# Patient Record
Sex: Male | Born: 1935 | Race: White | Hispanic: No | Smoking: Current some day smoker
Health system: Southern US, Community
[De-identification: ages and names within clinical notes are randomized; demographics above are authoritative.]

## PROBLEM LIST (undated history)

## (undated) DIAGNOSIS — K219 Gastro-esophageal reflux disease without esophagitis: Secondary | ICD-10-CM

## (undated) DIAGNOSIS — F32A Depression, unspecified: Secondary | ICD-10-CM

## (undated) DIAGNOSIS — I959 Hypotension, unspecified: Secondary | ICD-10-CM

## (undated) DIAGNOSIS — F329 Major depressive disorder, single episode, unspecified: Secondary | ICD-10-CM

## (undated) DIAGNOSIS — F419 Anxiety disorder, unspecified: Secondary | ICD-10-CM

## (undated) HISTORY — PX: CHOLECYSTECTOMY: SHX55

---

## 2010-08-01 ENCOUNTER — Emergency Department (HOSPITAL_BASED_OUTPATIENT_CLINIC_OR_DEPARTMENT_OTHER)
Admission: EM | Admit: 2010-08-01 | Discharge: 2010-08-01 | Disposition: A | Discharge status: Home / Self Care | Payer: Medicare PPO | Attending: Emergency Medicine | Admitting: Emergency Medicine

## 2010-08-01 DIAGNOSIS — Z79899 Other long term (current) drug therapy: Secondary | ICD-10-CM | POA: Insufficient documentation

## 2010-08-01 DIAGNOSIS — F17200 Nicotine dependence, unspecified, uncomplicated: Secondary | ICD-10-CM | POA: Insufficient documentation

## 2010-08-01 DIAGNOSIS — N390 Urinary tract infection, site not specified: Secondary | ICD-10-CM | POA: Insufficient documentation

## 2010-08-01 DIAGNOSIS — K219 Gastro-esophageal reflux disease without esophagitis: Secondary | ICD-10-CM | POA: Insufficient documentation

## 2010-08-01 DIAGNOSIS — F3289 Other specified depressive episodes: Secondary | ICD-10-CM | POA: Insufficient documentation

## 2010-08-01 DIAGNOSIS — F329 Major depressive disorder, single episode, unspecified: Secondary | ICD-10-CM | POA: Insufficient documentation

## 2010-08-01 DIAGNOSIS — R300 Dysuria: Secondary | ICD-10-CM | POA: Insufficient documentation

## 2010-08-01 LAB — DIFFERENTIAL
Lymphocytes Relative: 9 % — ABNORMAL LOW (ref 12–46)
Monocytes Absolute: 1.3 10*3/uL — ABNORMAL HIGH (ref 0.1–1.0)
Monocytes Relative: 8 % (ref 3–12)
Neutro Abs: 13.7 10*3/uL — ABNORMAL HIGH (ref 1.7–7.7)

## 2010-08-01 LAB — URINALYSIS, ROUTINE W REFLEX MICROSCOPIC
Ketones, ur: NEGATIVE mg/dL
Nitrite: NEGATIVE
Protein, ur: NEGATIVE mg/dL

## 2010-08-01 LAB — BASIC METABOLIC PANEL
CO2: 24 mEq/L (ref 19–32)
Calcium: 9.3 mg/dL (ref 8.4–10.5)
Glucose, Bld: 106 mg/dL — ABNORMAL HIGH (ref 70–99)
Sodium: 138 mEq/L (ref 135–145)

## 2010-08-01 LAB — CBC
HCT: 39.5 % (ref 39.0–52.0)
Hemoglobin: 13.5 g/dL (ref 13.0–17.0)
MCH: 30.5 pg (ref 26.0–34.0)
MCHC: 34.2 g/dL (ref 30.0–36.0)

## 2010-08-04 LAB — URINE CULTURE: Culture  Setup Time: 201205040901

## 2014-12-04 ENCOUNTER — Other Ambulatory Visit: Payer: Self-pay

## 2014-12-04 ENCOUNTER — Encounter (HOSPITAL_BASED_OUTPATIENT_CLINIC_OR_DEPARTMENT_OTHER): Payer: Self-pay

## 2014-12-04 ENCOUNTER — Emergency Department (HOSPITAL_BASED_OUTPATIENT_CLINIC_OR_DEPARTMENT_OTHER): Payer: Medicare (Managed Care)

## 2014-12-04 ENCOUNTER — Emergency Department (HOSPITAL_BASED_OUTPATIENT_CLINIC_OR_DEPARTMENT_OTHER)
Admission: EM | Admit: 2014-12-04 | Discharge: 2014-12-04 | Disposition: A | Discharge status: Other Acute Inpatient Hospital | Payer: Medicare (Managed Care) | Attending: Emergency Medicine | Admitting: Emergency Medicine

## 2014-12-04 DIAGNOSIS — F419 Anxiety disorder, unspecified: Secondary | ICD-10-CM | POA: Insufficient documentation

## 2014-12-04 DIAGNOSIS — F329 Major depressive disorder, single episode, unspecified: Secondary | ICD-10-CM | POA: Insufficient documentation

## 2014-12-04 DIAGNOSIS — K219 Gastro-esophageal reflux disease without esophagitis: Secondary | ICD-10-CM | POA: Diagnosis not present

## 2014-12-04 DIAGNOSIS — I951 Orthostatic hypotension: Secondary | ICD-10-CM

## 2014-12-04 DIAGNOSIS — Z720 Tobacco use: Secondary | ICD-10-CM | POA: Diagnosis not present

## 2014-12-04 DIAGNOSIS — R55 Syncope and collapse: Secondary | ICD-10-CM

## 2014-12-04 HISTORY — DX: Major depressive disorder, single episode, unspecified: F32.9

## 2014-12-04 HISTORY — DX: Depression, unspecified: F32.A

## 2014-12-04 HISTORY — DX: Hypotension, unspecified: I95.9

## 2014-12-04 HISTORY — DX: Anxiety disorder, unspecified: F41.9

## 2014-12-04 HISTORY — DX: Gastro-esophageal reflux disease without esophagitis: K21.9

## 2014-12-04 LAB — COMPREHENSIVE METABOLIC PANEL
ALBUMIN: 3.1 g/dL — AB (ref 3.5–5.0)
ALK PHOS: 52 U/L (ref 38–126)
ALT: 12 U/L — AB (ref 17–63)
ANION GAP: 8 (ref 5–15)
AST: 19 U/L (ref 15–41)
BUN: 24 mg/dL — ABNORMAL HIGH (ref 6–20)
CALCIUM: 8.7 mg/dL — AB (ref 8.9–10.3)
CHLORIDE: 106 mmol/L (ref 101–111)
CO2: 23 mmol/L (ref 22–32)
Creatinine, Ser: 1.95 mg/dL — ABNORMAL HIGH (ref 0.61–1.24)
GFR calc non Af Amer: 31 mL/min — ABNORMAL LOW (ref >60)
GFR, EST AFRICAN AMERICAN: 36 mL/min — AB (ref >60)
GLUCOSE: 105 mg/dL — AB (ref 65–99)
Potassium: 4.5 mmol/L (ref 3.5–5.1)
SODIUM: 137 mmol/L (ref 135–145)
Total Bilirubin: 0.1 mg/dL — ABNORMAL LOW (ref 0.3–1.2)
Total Protein: 6.7 g/dL (ref 6.5–8.1)

## 2014-12-04 LAB — CBC WITH DIFFERENTIAL/PLATELET
Basophils Absolute: 0.1 10*3/uL (ref 0.0–0.1)
Basophils Relative: 1 % (ref 0–1)
Eosinophils Absolute: 0.3 10*3/uL (ref 0.0–0.7)
Eosinophils Relative: 4 % (ref 0–5)
HEMATOCRIT: 38.5 % — AB (ref 39.0–52.0)
HEMOGLOBIN: 12.8 g/dL — AB (ref 13.0–17.0)
LYMPHS ABS: 1.9 10*3/uL (ref 0.7–4.0)
Lymphocytes Relative: 21 % (ref 12–46)
MCH: 30 pg (ref 26.0–34.0)
MCHC: 33.2 g/dL (ref 30.0–36.0)
MCV: 90.2 fL (ref 78.0–100.0)
MONO ABS: 0.7 10*3/uL (ref 0.1–1.0)
MONOS PCT: 8 % (ref 3–12)
NEUTROS ABS: 5.9 10*3/uL (ref 1.7–7.7)
NEUTROS PCT: 66 % (ref 43–77)
Platelets: 266 10*3/uL (ref 150–400)
RBC: 4.27 MIL/uL (ref 4.22–5.81)
RDW: 14.2 % (ref 11.5–15.5)
WBC: 8.9 10*3/uL (ref 4.0–10.5)

## 2014-12-04 LAB — TROPONIN I: Troponin I: <0.03 ng/mL (ref <0.031)

## 2014-12-04 MED ORDER — SODIUM CHLORIDE 0.9 % IV BOLUS (SEPSIS)
1000.0000 mL | Freq: Once | INTRAVENOUS | Status: AC
  Administered 2014-12-04: 1000 mL via INTRAVENOUS

## 2014-12-04 MED ORDER — SODIUM CHLORIDE 0.9 % IV BOLUS (SEPSIS)
500.0000 mL | Freq: Once | INTRAVENOUS | Status: AC
  Administered 2014-12-04: 500 mL via INTRAVENOUS

## 2014-12-04 MED ORDER — SODIUM CHLORIDE 0.9 % IV SOLN: INTRAVENOUS | Status: DC

## 2014-12-04 NOTE — ED Notes (Addendum)
Spoke with elease at [facility] about bed placement, rn made aware of plans.

## 2014-12-04 NOTE — ED Provider Notes (Signed)
CSN: [id number]     Arrival date & time 12/04/14  1230 History   First MD Initiated Contact with Patient 12/04/14 1250     Chief Complaint  Patient presents with  . Loss of Consciousness     (Consider location/radiation/quality/duration/timing/severity/associated sxs/prior Treatment) HPI Comments: Patient is a 81 year old male with history of depression, GERD. He presents for evaluation of weakness and near syncope. On 2 occasions this morning the patient experienced near syncopal episodes. He is getting out of the car and walking into the house when he suddenly felt as though he was going to faint. He had to grab the railing to the stairs and sit down for several minutes until the feeling passed. His wife helped him into the house where shortly afterward he experienced another episode. He was walking to the refrigerator and he began to feel as he was going to faint. His wife helped keep him from falling. Patient never lost consciousness. He denies any palpitations, chest pain, shortness of breath, or other symptoms. He states he does feel somewhat weak. His wife reports he was started on a new try cyclic antidepressant and last week. He was on this for several days before stopping it due to his not feeling well while taking it.  Patient is a 81 y.o. male presenting with syncope. The history is provided by the patient.  Loss of Consciousness Episode history:  Multiple Most recent episode:  Today Timing:  Intermittent Progression:  Resolved Chronicity:  New Witnessed: yes   Relieved by:  Nothing Worsened by:  Nothing tried Ineffective treatments:  None tried Associated symptoms: no chest pain, no palpitations and no shortness of breath     Past Medical History  Diagnosis Date  . Hypotension   . GERD (gastroesophageal reflux disease)   . Anxiety   . Depression    Past Surgical History  Procedure Laterality Date  . Cholecystectomy     No family history on file. Social History   Substance Use Topics  . Smoking status: Current Some Day Smoker  . Smokeless tobacco: None  . Alcohol Use: No    Review of Systems  Respiratory: Negative for shortness of breath.   Cardiovascular: Positive for syncope. Negative for chest pain and palpitations.  All other systems reviewed and are negative.     Allergies  Review of patient's allergies indicates no known allergies.  Home Medications   Prior to Admission medications   Medication Sig Start Date End Date Taking? Authorizing Provider  Lansoprazole (PREVACID PO) Take by mouth.   Yes Historical Provider, MD  LORAZEPAM PO Take by mouth.   Yes Historical Provider, MD  Metoclopramide HCl (REGLAN PO) Take by mouth.   Yes Historical Provider, MD  SIMVASTATIN PO Take by mouth.   Yes Historical Provider, MD  Vilazodone HCl (VIIBRYD PO) Take by mouth.   Yes Historical Provider, MD   BP 86/51 mmHg  Pulse 95  Temp(Src) 97.6 F (36.4 C) (Oral)  Resp 20  Ht 5\' 11"  (1.803 m)  Wt 180 lb (81.647 kg)  BMI 25.12 kg/m2  SpO2 93% Physical Exam  Constitutional: He is oriented to person, place, and time. He appears well-developed and well-nourished. No distress.  HENT:  Head: Normocephalic and atraumatic.  Mouth/Throat: Oropharynx is clear and moist.  Eyes: EOM are normal. Pupils are equal, round, and reactive to light.  Neck: Normal range of motion. Neck supple.  Cardiovascular: Normal rate, regular rhythm and normal heart sounds.   No murmur heard. Pulmonary/Chest: Effort  normal and breath sounds normal. No respiratory distress. He has no wheezes.  Abdominal: Soft. Bowel sounds are normal. He exhibits no distension. There is no tenderness.  Musculoskeletal: Normal range of motion. He exhibits no edema.  Lymphadenopathy:    He has no cervical adenopathy.  Neurological: He is alert and oriented to person, place, and time.  Skin: Skin is warm and dry. He is not diaphoretic.  Nursing note and vitals reviewed.   ED Course   Procedures (including critical care time) Labs Review Labs Reviewed  COMPREHENSIVE METABOLIC PANEL  CBC WITH DIFFERENTIAL/PLATELET  TROPONIN I    Imaging Review No results found. I have personally reviewed and evaluated these images and lab results as part of my medical decision-making.   EKG Interpretation   Date/Time:  Monday December 04 2014 12:34:37 EDT Ventricular Rate:  101 PR Interval:  138 QRS Duration: 90 QT Interval:  352 QTC Calculation: 456 R Axis:   65 Text Interpretation:  Sinus tachycardia Otherwise normal ECG Confirmed by  Lourdes Kucharski  MD, Janah Mcculloh ([id number]) on 12/04/2014 12:38:12 PM      MDM   Final diagnoses:  None    Patient is a 81 year old male who presents with near-syncope. He has markedly orthostatic vital signs. He was hydrated with 1 L of normal saline and remains orthostatic. His workup reveals renal insufficiency with a creatinine of 1.95 which is above his baseline. He has a mild anemia of 12.8, however the remainder the workup is unremarkable.  I am uncertain as to what is driving this patient's hypotension and orthostasis. He does not appear febrile, toxic, or septic. His white count is 9 with no shift. I have also considered pulmonary embolism, however do not feel is appropriate to pursue this at this facility as the patient's creatinine is elevated. He may well need a VQ scan if his orthostasis does not improve with hydration.  The patient's primary doctor is a physician at [facility]. I've spoken with Dr. Sheppard Penton, the admitting doctor for the hospitalist service there who agrees to accept the patient in transfer.  CRITICAL CARE Performed by: Geoffery Lyons Total critical care time: 45 minutes Critical care time was exclusive of separately billable procedures and treating other patients. Critical care was necessary to treat or prevent imminent or life-threatening deterioration. Critical care was time spent personally by me on the following  activities: development of treatment plan with patient and/or surrogate as well as nursing, discussions with consultants, evaluation of patient's response to treatment, examination of patient, obtaining history from patient or surrogate, ordering and performing treatments and interventions, ordering and review of laboratory studies, ordering and review of radiographic studies, pulse oximetry and re-evaluation of patient's condition.     Geoffery Lyons, MD 12/04/14 [phone]

## 2014-12-04 NOTE — ED Notes (Signed)
Pt states he passed out after driving self home-left side CP at that time-none at present-A/O

## 2016-07-02 IMAGING — CR DG CHEST 2V
1 series · 1 of 1 positions shown · non-contrast
Comparison: None.

CLINICAL DATA: Two syncopal episodes today and one 2 days ago.

EXAM:
CHEST  2 VIEW

[view not recorded]
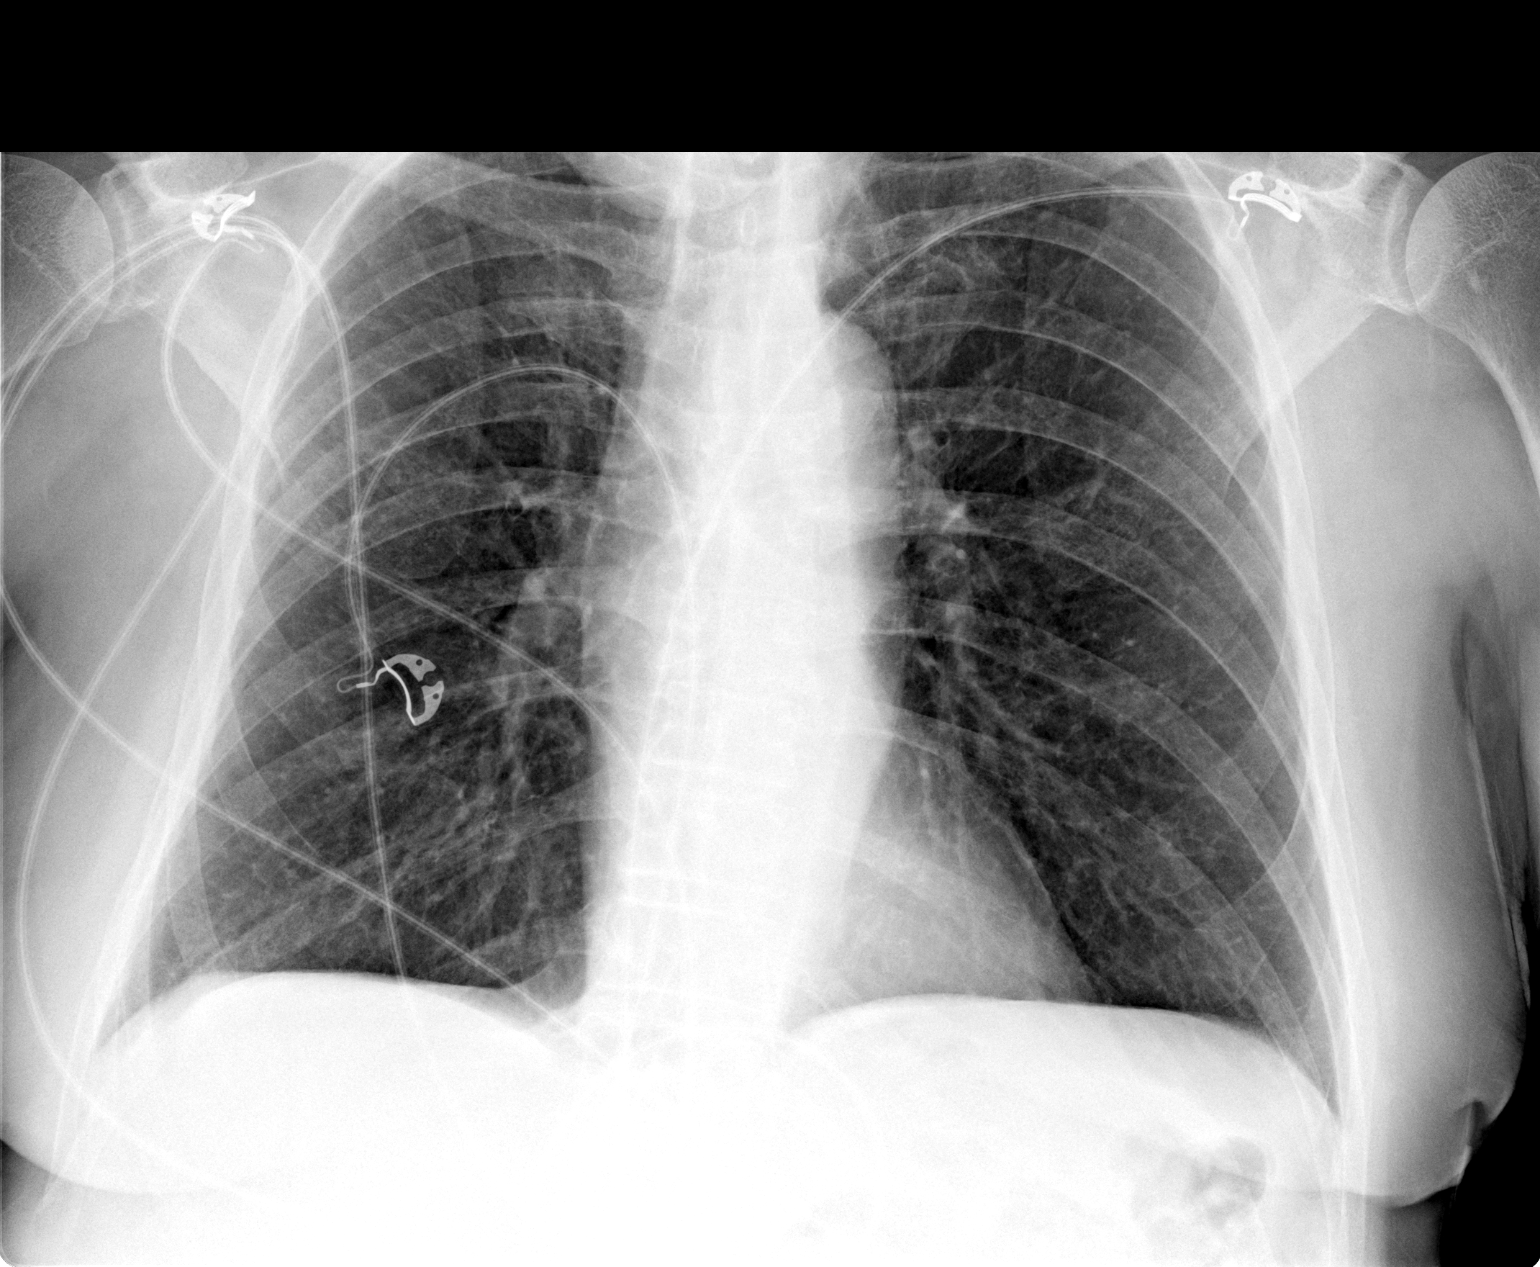

[1 of 1 positions shown; findings below may reference images not displayed]

FINDINGS: Normal sized heart. Clear lungs. The lungs are hyperexpanded with
mild diffuse peribronchial thickening and accentuation of the
interstitial markings. Lower thoracic spine degenerative changes.
IMPRESSION: No acute abnormality.  Mild changes of COPD and chronic bronchitis.

## 2021-01-29 DEATH — deceased
